# Patient Record
Sex: Female | Born: 1990 | Race: White | Hispanic: No | Marital: Single | Smoking: Former smoker
Health system: Southern US, Community
[De-identification: ages and names within clinical notes are randomized; demographics above are authoritative.]

## PROBLEM LIST (undated history)

## (undated) DIAGNOSIS — Z789 Other specified health status: Secondary | ICD-10-CM

## (undated) DIAGNOSIS — M419 Scoliosis, unspecified: Secondary | ICD-10-CM

## (undated) HISTORY — PX: APPENDECTOMY: SHX54

---

## 2013-01-24 ENCOUNTER — Encounter: Payer: Self-pay | Admitting: Obstetrics and Gynecology

## 2014-09-06 ENCOUNTER — Encounter: Payer: Self-pay | Admitting: Obstetrics & Gynecology

## 2014-09-12 ENCOUNTER — Ambulatory Visit (INDEPENDENT_AMBULATORY_CARE_PROVIDER_SITE_OTHER): Payer: BLUE CROSS/BLUE SHIELD | Admitting: Family Medicine

## 2014-09-12 ENCOUNTER — Ambulatory Visit (HOSPITAL_COMMUNITY)
Admission: RE | Admit: 2014-09-12 | Discharge: 2014-09-12 | Disposition: A | Discharge status: Home / Self Care | Payer: BC Managed Care – PPO | Source: Ambulatory Visit | Attending: Family Medicine | Admitting: Family Medicine

## 2014-09-12 ENCOUNTER — Encounter: Payer: Self-pay | Admitting: Family Medicine

## 2014-09-12 VITALS — BP 123/83 | HR 112 | Wt 110.0 lb

## 2014-09-12 DIAGNOSIS — R102 Pelvic and perineal pain: Secondary | ICD-10-CM

## 2014-09-12 DIAGNOSIS — N949 Unspecified condition associated with female genital organs and menstrual cycle: Secondary | ICD-10-CM | POA: Insufficient documentation

## 2014-09-12 DIAGNOSIS — Z30431 Encounter for routine checking of intrauterine contraceptive device: Secondary | ICD-10-CM | POA: Diagnosis not present

## 2014-09-12 LAB — POCT URINE PREGNANCY: Preg Test, Ur: NEGATIVE

## 2014-09-12 NOTE — Assessment & Plan Note (Signed)
?   Related to IUD--will check sono to confirm placement--discussed options for contraception, she might like to try Depo--as she would like to gain weight, if her IUD needs to be removed.

## 2014-09-12 NOTE — Patient Instructions (Signed)
Pelvic Pain Female pelvic pain can be caused by many different things and start from a variety of places. Pelvic pain refers to pain that is located in the lower half of the abdomen and between your hips. The pain may occur over a short period of time (acute) or may be reoccurring (chronic). The cause of pelvic pain may be related to disorders affecting the female reproductive organs (gynecologic), but it may also be related to the bladder, kidney stones, an intestinal complication, or muscle or skeletal problems. Getting help right away for pelvic pain is important, especially if there has been severe, sharp, or a sudden onset of unusual pain. It is also important to get help right away because some types of pelvic pain can be life threatening.  CAUSES  Below are only some of the causes of pelvic pain. The causes of pelvic pain can be in one of several categories.   Gynecologic.  Pelvic inflammatory disease.  Sexually transmitted infection.  Ovarian cyst or a twisted ovarian ligament (ovarian torsion).  Uterine lining that grows outside the uterus (endometriosis).  Fibroids, cysts, or tumors.  Ovulation.  Pregnancy.  Pregnancy that occurs outside the uterus (ectopic pregnancy).  Miscarriage.  Labor.  Abruption of the placenta or ruptured uterus.  Infection.  Uterine infection (endometritis).  Bladder infection.  Diverticulitis.  Miscarriage related to a uterine infection (septic abortion).  Bladder.  Inflammation of the bladder (cystitis).  Kidney stone(s).  Gastrointestinal.  Constipation.  Diverticulitis.  Neurologic.  Trauma.  Feeling pelvic pain because of mental or emotional causes (psychosomatic).  Cancers of the bowel or pelvis. EVALUATION  Your caregiver will want to take a careful history of your concerns. This includes recent changes in your health, a careful gynecologic history of your periods (menses), and a sexual history. Obtaining your family  history and medical history is also important. Your caregiver may suggest a pelvic exam. A pelvic exam will help identify the location and severity of the pain. It also helps in the evaluation of which organ system may be involved. In order to identify the cause of the pelvic pain and be properly treated, your caregiver may order tests. These tests may include:   A pregnancy test.  Pelvic ultrasonography.  An X-ray exam of the abdomen.  A urinalysis or evaluation of vaginal discharge.  Blood tests. HOME CARE INSTRUCTIONS   Only take over-the-counter or prescription medicines for pain, discomfort, or fever as directed by your caregiver.   Rest as directed by your caregiver.   Eat a balanced diet.   Drink enough fluids to make your urine clear or pale yellow, or as directed.   Avoid sexual intercourse if it causes pain.   Apply warm or cold compresses to the lower abdomen depending on which one helps the pain.   Avoid stressful situations.   Keep a journal of your pelvic pain. Write down when it started, where the pain is located, and if there are things that seem to be associated with the pain, such as food or your menstrual cycle.  Follow up with your caregiver as directed.  SEEK MEDICAL CARE IF:  Your medicine does not help your pain.  You have abnormal vaginal discharge. SEEK IMMEDIATE MEDICAL CARE IF:   You have heavy bleeding from the vagina.   Your pelvic pain increases.   You feel light-headed or faint.   You have chills.   You have pain with urination or blood in your urine.   You have uncontrolled diarrhea   or vomiting.   You have a fever or persistent symptoms for more than 3 days.  You have a fever and your symptoms suddenly get worse.   You are being physically or sexually abused.  MAKE SURE YOU:  Understand these instructions.  Will watch your condition.  Will get help if you are not doing well or get worse. Document Released:  07/08/2004 Document Revised: 12/26/2013 Document Reviewed: 12/01/2011 ExitCare Patient Information 2015 [employer]. This information is not intended to replace advice given to you by your health care provider. Make sure you discuss any questions you have with your health care provider.  

## 2014-09-12 NOTE — Progress Notes (Signed)
    Subjective:    Patient ID: Hayley May is a 24 y.o. female presenting with Contraception  on 09/12/2014  HPI: Mirena IUD placed 14 months ago.  Has sporadic cycles. Reports low abdominal pain since it was placed and feels like it is stabbing.  Pain comes daily and lasts 15-20 minutes. Notes bleeding after sex. Wants IUD checked. Also, notes no h/o pap, although one was likely done with pregnancy--does not think she has a h/o abnl pap.  Review of Systems  Constitutional: Positive for unexpected weight change (having trouble gaining weight). Negative for fever and chills.  Respiratory: Negative for shortness of breath.   Cardiovascular: Negative for chest pain.  Genitourinary: Positive for vaginal bleeding, vaginal discharge and pelvic pain. Negative for menstrual problem.      Objective:    BP 123/83 mmHg  Pulse 112  Wt 110 lb (49.896 kg) Physical Exam  Constitutional: She is oriented to person, place, and time. She appears well-developed and well-nourished. No distress.  HENT:  Head: Normocephalic and atraumatic.  Eyes: No scleral icterus.  Neck: Neck supple.  Cardiovascular: Normal rate.   Pulmonary/Chest: Effort normal.  Abdominal: Soft.  Genitourinary:  BUS normal, vagina is pink and rugated, cervix is mutiparous without lesion, IUD strings cannot be visualized   Neurological: She is alert and oriented to person, place, and time.  Skin: Skin is warm and dry.  Psychiatric: She has a normal mood and affect.        Assessment & Plan:   Problem List Items Addressed This Visit      Unprioritized   Pelvic pain in female - Primary    ? Related to IUD--will check sono to confirm placement--discussed options for contraception, she might like to try Depo--as she would like to gain weight, if her IUD needs to be removed.      Relevant Orders   US Pelvis Complete   US Transvaginal Non-OB   POCT urine pregnancy (Completed)       Return in about 4 weeks (around  10/10/2014).

## 2014-11-14 ENCOUNTER — Ambulatory Visit (INDEPENDENT_AMBULATORY_CARE_PROVIDER_SITE_OTHER): Payer: BLUE CROSS/BLUE SHIELD | Admitting: Family Medicine

## 2014-11-14 VITALS — BP 117/74 | HR 88 | Ht 65.5 in | Wt 111.2 lb

## 2014-11-14 DIAGNOSIS — Z975 Presence of (intrauterine) contraceptive device: Secondary | ICD-10-CM

## 2014-11-14 DIAGNOSIS — Z113 Encounter for screening for infections with a predominantly sexual mode of transmission: Secondary | ICD-10-CM

## 2014-11-14 DIAGNOSIS — N898 Other specified noninflammatory disorders of vagina: Secondary | ICD-10-CM | POA: Diagnosis not present

## 2014-11-14 DIAGNOSIS — R110 Nausea: Secondary | ICD-10-CM

## 2014-11-14 NOTE — Progress Notes (Signed)
Pelvic pain, vaginal discharge with odor.  Pain with intercourse. Wants STD screening.

## 2014-11-14 NOTE — Progress Notes (Signed)
    Subjective:    Patient ID: Hayley May is a 24 y.o. female presenting with Pelvic Pain  on 11/14/2014  HPI: Reports less often pain and no more bleeding after intercourse.  2 month h/o vaginal odor and discharge. Denies irritation. Reports OTC yeast that did not work.  Improved with rePhresh  Review of Systems  Constitutional: Negative for fever and chills.  Respiratory: Negative for shortness of breath.   Cardiovascular: Negative for chest pain.  Gastrointestinal: Negative for nausea, vomiting and abdominal pain.  Genitourinary: Negative for dysuria.  Skin: Negative for rash.      Objective:    BP 117/74 mmHg  Pulse 88  Ht 5' 5.5" (1.664 m)  Wt 111 lb 4 oz (50.463 kg)  BMI 18.22 kg/m2 Physical Exam  Constitutional: She is oriented to person, place, and time. She appears well-developed and well-nourished. No distress.  HENT:  Head: Normocephalic and atraumatic.  Eyes: No scleral icterus.  Neck: Neck supple.  Cardiovascular: Normal rate.   Pulmonary/Chest: Effort normal.  Abdominal: Soft.  Genitourinary: Vagina normal. No vaginal discharge found.  Neurological: She is alert and oriented to person, place, and time.  Skin: Skin is warm and dry.  Psychiatric: She has a normal mood and affect.        Assessment & Plan:   Problem List Items Addressed This Visit    None    Visit Diagnoses    Vaginal discharge    -  Primary    Relevant Orders    GC/Chlamydia Probe Amp (Completed)    Wet prep, genital    Screen for STD (sexually transmitted disease)        Relevant Orders    HIV antibody (Completed)    RPR (Completed)    Hepatitis B surface antigen (Completed)       Return in about 2 months (around 01/14/2015) for a follow-up.

## 2014-11-14 NOTE — Patient Instructions (Signed)

## 2014-11-15 ENCOUNTER — Encounter: Payer: Self-pay | Admitting: Family Medicine

## 2014-11-15 LAB — RPR

## 2014-11-15 LAB — GC/CHLAMYDIA PROBE AMP
CT Probe RNA: NEGATIVE
GC PROBE AMP APTIMA: NEGATIVE

## 2014-11-15 LAB — HEPATITIS B SURFACE ANTIGEN: Hepatitis B Surface Ag: NEGATIVE

## 2014-11-15 LAB — WET PREP, GENITAL
TRICH WET PREP: NONE SEEN
YEAST WET PREP: NONE SEEN

## 2014-11-15 LAB — HIV ANTIBODY (ROUTINE TESTING W REFLEX): HIV: NONREACTIVE

## 2014-11-20 ENCOUNTER — Telehealth: Payer: Self-pay | Admitting: *Deleted

## 2014-11-20 DIAGNOSIS — B9689 Other specified bacterial agents as the cause of diseases classified elsewhere: Secondary | ICD-10-CM

## 2014-11-20 DIAGNOSIS — N760 Acute vaginitis: Principal | ICD-10-CM

## 2014-11-20 MED ORDER — METRONIDAZOLE 500 MG PO TABS: 500.0000 mg | ORAL_TABLET | Freq: Two times a day (BID) | ORAL | Status: DC

## 2014-11-20 NOTE — Telephone Encounter (Signed)
Pt aware.  Will send in prescription.

## 2014-11-20 NOTE — Telephone Encounter (Signed)
-----   Message from Reva Boresanya S Pratt, MD sent at 11/15/2014  8:08 AM EDT ----- Has BV-re-treat with Flagyl 500 mg bid x 7 days, # 14, no RF
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
## 2015-04-19 ENCOUNTER — Other Ambulatory Visit (INDEPENDENT_AMBULATORY_CARE_PROVIDER_SITE_OTHER): Payer: BLUE CROSS/BLUE SHIELD | Admitting: *Deleted

## 2015-04-19 DIAGNOSIS — N898 Other specified noninflammatory disorders of vagina: Secondary | ICD-10-CM

## 2015-04-19 NOTE — Progress Notes (Signed)
History of Present Illness   Patient Identification Hayley May is a 24 y.o. female.  Chief Complaint  Vaginal Discharge   The patient complains vaginal discharge and odor. Onset of symptoms was 3 days. Severity of symptoms now is moderate. Symptoms have been constant. Other modifying factors include history of (Yeast/BV). Care prior to arrival consisted of OTC ointment, with no relief.    Plan:  Wet Prep today Advised to try OTC BV/Yeast medications and Probiotic until lab results are back

## 2015-04-20 LAB — WET PREP, GENITAL
Trich, Wet Prep: NONE SEEN
WBC, Wet Prep HPF POC: NONE SEEN
Yeast Wet Prep HPF POC: NONE SEEN

## 2016-10-20 ENCOUNTER — Encounter: Payer: Self-pay | Admitting: *Deleted

## 2016-10-20 ENCOUNTER — Encounter: Payer: Self-pay | Admitting: Obstetrics & Gynecology

## 2016-10-20 ENCOUNTER — Ambulatory Visit (INDEPENDENT_AMBULATORY_CARE_PROVIDER_SITE_OTHER): Payer: BLUE CROSS/BLUE SHIELD | Admitting: Obstetrics & Gynecology

## 2016-10-20 VITALS — BP 112/76 | HR 80 | Resp 18 | Ht 66.0 in | Wt 120.0 lb

## 2016-10-20 DIAGNOSIS — Z538 Procedure and treatment not carried out for other reasons: Secondary | ICD-10-CM

## 2016-10-20 DIAGNOSIS — Z975 Presence of (intrauterine) contraceptive device: Secondary | ICD-10-CM | POA: Diagnosis not present

## 2016-10-20 MED ORDER — NORGESTIMATE-ETH ESTRADIOL 0.25-35 MG-MCG PO TABS
1.0000 | ORAL_TABLET | Freq: Every day | ORAL | 11 refills | Status: DC
Start: 2016-10-20 — End: 2017-01-22

## 2016-10-20 NOTE — Patient Instructions (Signed)
Return to clinic for any scheduled appointments or for any gynecologic concerns as needed.   

## 2016-10-20 NOTE — Progress Notes (Signed)
    GYNECOLOGY OFFICE PROCEDURE NOTE  Hayley May is a 26 y.o. G2P1011 here for Mirena IUD removal due to concerning side effects. Desires OCPs instead.  IUD Removal Attempt Patient identified, informed consent performed, consent signed.  Patient was in the dorsal lithotomy position, normal external genitalia was noted.  A speculum was placed in the patient's vagina, normal discharge was noted, no lesions. The cervix was visualized, no lesions, no abnormal discharge.  The strings of the IUD were not visualized, so cervix was cleaned with Betadine, and Kelly forceps were introduced into the lower uterine segment and endometrial cavity. The IUD was palpated but not able to be grasped due to mucus discharge  And bleeding; IUD hook was also tried multiple times but was unsuccessful.  Procedure aborted.    Patient was given option of removal in OR or [facility]  [facility], this was scheduled on 11/17/16. OCPs were prescribed for her to use as directed for contraception after IUD removal.   Routine preventative health maintenance measures emphasized. She will return for pap smear here in 11/2016.   Jaynie CollinsUGONNA  Lanson Randle, MD, FACOG Attending Obstetrician & Gynecologist, [facility], [facility]

## 2016-11-17 ENCOUNTER — Ambulatory Visit: Payer: BLUE CROSS/BLUE SHIELD | Admitting: Obstetrics & Gynecology

## 2016-11-25 ENCOUNTER — Encounter: Payer: BLUE CROSS/BLUE SHIELD | Admitting: Obstetrics & Gynecology

## 2016-11-25 DIAGNOSIS — Z01419 Encounter for gynecological examination (general) (routine) without abnormal findings: Secondary | ICD-10-CM

## 2017-01-08 ENCOUNTER — Other Ambulatory Visit: Payer: Self-pay | Admitting: Obstetrics and Gynecology

## 2017-01-08 DIAGNOSIS — Z369 Encounter for antenatal screening, unspecified: Secondary | ICD-10-CM

## 2017-01-22 ENCOUNTER — Encounter: Payer: Self-pay | Admitting: *Deleted

## 2017-01-22 ENCOUNTER — Ambulatory Visit
Admission: RE | Admit: 2017-01-22 | Discharge: 2017-01-22 | Disposition: A | Discharge status: Home / Self Care | Payer: BLUE CROSS/BLUE SHIELD | Source: Ambulatory Visit | Attending: Maternal and Fetal Medicine | Admitting: Maternal and Fetal Medicine

## 2017-01-22 VITALS — BP 125/79 | HR 95 | Temp 97.9°F | Resp 18 | Ht 66.0 in | Wt 126.0 lb

## 2017-01-22 DIAGNOSIS — Z3A12 12 weeks gestation of pregnancy: Secondary | ICD-10-CM | POA: Diagnosis not present

## 2017-01-22 DIAGNOSIS — Z820 Family history of epilepsy and other diseases of the nervous system: Secondary | ICD-10-CM | POA: Diagnosis not present

## 2017-01-22 DIAGNOSIS — Z8279 Family history of other congenital malformations, deformations and chromosomal abnormalities: Secondary | ICD-10-CM | POA: Insufficient documentation

## 2017-01-22 DIAGNOSIS — Z369 Encounter for antenatal screening, unspecified: Secondary | ICD-10-CM

## 2017-01-22 DIAGNOSIS — Z3481 Encounter for supervision of other normal pregnancy, first trimester: Secondary | ICD-10-CM | POA: Insufficient documentation

## 2017-01-22 HISTORY — DX: Other specified health status: Z78.9

## 2017-01-22 NOTE — Progress Notes (Addendum)
Referring physician:  [facility] Length of Consultation: 45 minutes   Hayley May  was referred to [facility] [place] for genetic counseling to review prenatal screening and testing options.  We also reviewed the family history from her prior visit and the history of a heart defect in her current partner.  This note summarizes the information we discussed.    We offered the following routine screening tests for this pregnancy:  First trimester screening, which includes nuchal translucency ultrasound screen and first trimester maternal serum marker screening.  The nuchal translucency has approximately an 80% detection rate for Down syndrome and can be positive for other chromosome abnormalities as well as congenital heart defects.  When combined with a maternal serum marker screening, the detection rate is up to 90% for Down syndrome and up to 97% for trisomy 18.     Maternal serum marker screening, a blood test that measures pregnancy proteins, can provide risk assessments for Down syndrome, trisomy 18, and open neural tube defects (spina bifida, anencephaly). Because it does not directly examine the fetus, it cannot positively diagnose or rule out these problems.  Targeted ultrasound uses high frequency sound waves to create an image of the developing fetus.  An ultrasound is often recommended as a routine means of evaluating the pregnancy.  It is also used to screen for fetal anatomy problems (for example, a heart defect) that might be suggestive of a chromosomal or other abnormality.   Should these screening tests indicate an increased concern, then the following additional testing options would be offered:  The chorionic villus sampling procedure is available for first trimester chromosome analysis.  This involves the withdrawal of a small amount of chorionic villi (tissue from the developing placenta).  Risk of pregnancy loss is estimated to be  approximately 1 in 200 to 1 in 100 (0.5 to 1%).  There is approximately a 1% (1 in 100) chance that the CVS chromosome results will be unclear.  Chorionic villi cannot be tested for neural tube defects.     Amniocentesis involves the removal of a small amount of amniotic fluid from the sac surrounding the fetus with the use of a thin needle inserted through the maternal abdomen and uterus.  Ultrasound guidance is used throughout the procedure.  Fetal cells from amniotic fluid are directly evaluated and > 99.5% of chromosome problems and > 98% of open neural tube defects can be detected. This procedure is generally performed after the 15th week of pregnancy.  The main risks to this procedure include complications leading to miscarriage in less than 1 in 200 cases (0.5%).  As another option for information if the pregnancy is suspected to be an an increased chance for certain chromosome conditions, we also reviewed the availability of cell free fetal DNA testing from maternal blood to determine whether or not the baby may have either Down syndrome, trisomy 71, or trisomy 58.  This test utilizes a maternal blood sample and DNA sequencing technology to isolate circulating cell free fetal DNA from maternal plasma.  The fetal DNA can then be analyzed for DNA sequences that are derived from the three most common chromosomes involved in aneuploidy, chromosomes 13, 18, and 21.  If the overall amount of DNA is greater than the expected level for any of these chromosomes, aneuploidy is suspected.  While we do not consider it a replacement for invasive testing and karyotype analysis, a negative result from this testing would be reassuring, though not  a guarantee of a normal chromosome complement for the baby.  An abnormal result is certainly suggestive of an abnormal chromosome complement, though we would still recommend CVS or amniocentesis to confirm any findings from this testing.  Cystic Fibrosis and Spinal Muscular  Atrophy (SMA) screening were also discussed with the patient. Both conditions are recessive, which means that both parents must be carriers in order to have a child with the disease.  Cystic fibrosis (CF) is one of the most common genetic conditions in persons of Caucasian ancestry.  This condition occurs in approximately 1 in 2,500 Caucasian persons and results in thickened secretions in the lungs, digestive, and reproductive systems.  For a baby to be at risk for having CF, both of the parents must be carriers for this condition.  Approximately 1 in 53 Caucasian persons is a carrier for CF.  Current carrier testing looks for the most common mutations in the gene for CF and can detect approximately 90% of carriers in the Caucasian population.  This means that the carrier screening can greatly reduce, but cannot eliminate, the chance for an individual to have a child with CF.  If an individual is found to be a carrier for CF, then carrier testing would be available for the partner. As part of [facility]'s newborn screening profile, all babies born in the state of [place] will have a two-tier screening process.  Specimens are first tested to determine the concentration of immunoreactive trypsinogen (IRT).  The top 5% of specimens with the highest IRT values then undergo DNA testing using a panel of over 40 common CF mutations. SMA is a neurodegenerative disorder that leads to atrophy of skeletal muscle and overall weakness.  This condition is also more prevalent in the Caucasian population, with 1 in 40-1 in 60 persons being a carrier and 1 in 6,000-1 in 10,000 children being affected.  There are multiple forms of the disease, with some causing death in infancy to other forms with survival into adulthood.  The genetics of SMA is complex, but carrier screening can detect up to 95% of carriers in the Caucasian population.  Similar to CF, a negative result can greatly reduce, but cannot eliminate, the chance  to have a child with SMA.  We obtained a detailed family history and pregnancy history.  The father of the baby, Hayley May, stated that he was born with a VSD.  He recalls that it was large when he was young, but that by high school it was very small and he was allowed to play contact sports.  At his recent doctor visits, he says that it can no longer be heard on physical exam.  He has no other health or developmental concerns.  He does report a maternal great uncle who passed away in his teens.  This relative was said to have been born with a large VSD and then was hit in the chest with a football causing sudden death.  No other family members have had cardiac defects.  Congenital heart defects are among the most common birth defects, occuring in approximately 1 in 200 livebirths.  They may occur as an isolated condition or in combination with other differences in numerous genetic syndromes.  In the absence of a known genetic syndrome, most congenital heart defects are multifactorial, or caused by a combination of genetic and environmental factors.  Given the father of this baby had a heart defect, the chance for a heart defect in this pregnancy may be as  high as 3%.  However, if more information is learned or a specific genetic syndrome is identified as the cause, we are happy to discuss this information further.  We discussed that we may diagnose many heart defects by using a level 2 ultrasound after 16 to [redacted] weeks gestation.  A fetal echocardiogram is recommended.  This is a specialized ultrasound of the fetal heart performed by a pediatric cardiologist after [redacted] weeks gestation.  However, it is important to remember that not all heart defects can be detected prenatally.    We obtained a detailed family history of Hayley May in her last pregnancy.  She has scoliosis, which is estimated to have a recurrence chance of 5-7%.  She reported a nephew with apparently isolated unilateral club foot, which is  expected to have a low recurrence risk.  She also mentioned a maternal first cousin with developmental differences, ear problems and Asperger.  Again we reviewed that without a clear diagnosis for her underlying condition, an accurate risk estimate for other family members is difficult.  We are happy to review medical information if more is learned.  The remainder of the family history was reported to be unremarkable for birth defects, intellectual delays, recurrent pregnancy loss or known chromosome abnormalities.  Hayley May stated that this is her third pregnancy.  She had an early miscarriage in 2007, has a healthy son who is three, and now is pregnant with a new partner.  He has two healthy daughters from prior relationships.  She reported no complications or exposures in this pregnancy other than medications of nausea given by her OB.  She stopped smoking as soon as she found out she was pregnant this time.  In her last pregnancy, Hayley May was transferred to [facility] care and for delivery due to her scoliosis.  See those notes from Dr. Leatha GildingLivingston in 2014 if needed.  After consideration of the options, Hayley May elected to proceed with first trimester screening.  She declined carrier testing for CF and SMA.    An ultrasound was performed at the time of the visit.  The gestational age was consistent with 12 weeks.  Fetal anatomy could not be assessed due to early gestational age.  Please refer to the ultrasound report for details of that study.  An anatomy ultrasound should be performed after [redacted] weeks gestation and a fetal echocardiogram should be scheduled for after [redacted] weeks gestation.  Please let us know if we can help provide or schedule these appointments.  Hayley May was encouraged to call with questions or concerns.  We can be contacted at [phone].    Cherly Andersoneborah F. Wells, MS, CGC  I was immediately available and supervising. Camelia PhenesBrenna L Kylie Gros, MD [facility]

## 2017-01-29 ENCOUNTER — Telehealth: Payer: Self-pay | Admitting: Obstetrics and Gynecology

## 2017-01-29 NOTE — Telephone Encounter (Signed)
Ms. Hayley May  elected to undergo First Trimester screening on 01/22/2017.  To review, first trimester screening, includes nuchal translucency ultrasound screen and/or first trimester maternal serum marker screening.  The nuchal translucency has approximately an 80% detection rate for Down syndrome and can be positive for other chromosome abnormalities as well as heart defects.  When combined with a maternal serum marker screening, the detection rate is up to 90% for Down syndrome and up to 97% for trisomy 13 and 18.     The results of the First Trimester Nuchal Translucency and Biochemical Screening were within normal range.  The risk for Down syndrome is now estimated to be 1 in 3,703.  The risk for Trisomy 13/18 is less than 1 in 10,000.  Should more definitive information be desired, we would offer amniocentesis.  Because we do not yet know the effectiveness of combined first and second trimester screening, we do not recommend a maternal serum screen to assess the chance for chromosome conditions.  However, if screening for neural tube defects is desired, maternal serum screening for AFP only can be performed between 15 and [redacted] weeks gestation.     Cherly Andersoneborah F. Shericka Johnstone, MS, CGC

## 2017-02-17 ENCOUNTER — Inpatient Hospital Stay: Admission: RE | Admit: 2017-02-17 | Payer: BLUE CROSS/BLUE SHIELD | Source: Ambulatory Visit

## 2017-03-09 ENCOUNTER — Other Ambulatory Visit: Payer: Self-pay | Admitting: Maternal & Fetal Medicine

## 2017-03-09 ENCOUNTER — Ambulatory Visit
Admission: RE | Admit: 2017-03-09 | Discharge: 2017-03-09 | Disposition: A | Discharge status: Home / Self Care | Payer: Medicaid Other | Source: Ambulatory Visit | Attending: Maternal & Fetal Medicine | Admitting: Maternal & Fetal Medicine

## 2017-03-09 DIAGNOSIS — M419 Scoliosis, unspecified: Secondary | ICD-10-CM | POA: Diagnosis not present

## 2017-03-09 DIAGNOSIS — Z8279 Family history of other congenital malformations, deformations and chromosomal abnormalities: Secondary | ICD-10-CM | POA: Diagnosis present

## 2017-03-09 DIAGNOSIS — O9989 Other specified diseases and conditions complicating pregnancy, childbirth and the puerperium: Secondary | ICD-10-CM | POA: Insufficient documentation

## 2017-03-09 DIAGNOSIS — Z3A18 18 weeks gestation of pregnancy: Secondary | ICD-10-CM | POA: Insufficient documentation

## 2017-03-09 HISTORY — DX: Scoliosis, unspecified: M41.9

## 2017-10-29 ENCOUNTER — Encounter (HOSPITAL_COMMUNITY): Payer: Self-pay

## 2018-11-24 IMAGING — US US MFM OB DETAIL+14 WK
1 of 2 series · 12 of 28 positions shown · non-contrast
Comparison: none

PATIENT INFO:

PERFORMED BY:
SERVICE(S) PROVIDED:
INDICATIONS:
18 weeks gestation of pregnancy
FETAL EVALUATION:
Num Of Fetuses:     1
Fetal Heart         147
Rate(bpm):
Cardiac Activity:   Present
Presentation:       Vertex
Placenta:           Posterior Grade 1, No previa
Largest Pocket(cm)
4.1
BIOMETRY:
BPD:      41.1  mm     G. Age:  18w 3d         46  %    CI:          79.4  %    70 - 86
FL/HC:       18.8  %    16.1 -
HC:      145.8  mm     G. Age:  17w 5d         10  %
FL/BPD:      66.7  %
FL:       27.4  mm     G. Age:  18w 3d         37  %
HUM:      26.4  mm     G. Age:  18w 2d         46  %
CER:        18  mm     G. Age:  18w 0d         30  %
NFT:       1.9  mm
CM:        1.7  mm
GESTATIONAL AGE:
LMP:           18w 4d        Date:  10/30/16                 EDD:   08/06/17
U/S Today:     18w 1d                                        EDD:   08/09/17
Best:          18w 4d     Det. By:  LMP  (10/30/16)          EDD:   08/06/17
ANATOMY:
Cranium:               Within Normal Limits   Aortic Arch:            Normal appearance
Cavum:                 CSP visualized         Ductal Arch:            Normal appearance
Ventricles:            Normal appearance      Diaphragm:              Within Normal Limits
Choroid Plexus:        Within Normal Limits   Stomach:                Seen
Cerebellum:            Within Normal Limits   Abdomen:                Within Normal
Limits
Posterior Fossa:       Within Normal Limits   Abdominal Wall:         Normal appearance
Nuchal Fold:           Within Normal Limits   Cord Vessels:           3 vessels
Face:                  Orbits visualized      Kidneys:                Normal appearance
Lips:                  Normal appearance      Bladder:                Seen
Thoracic:              Within Normal Limits   Spine:                  Normal appearance
Heart:                 4-Chamber view         Upper Extremities:      Visualized
appears normal
RVOT:                  Normal appearance      Lower Extremities:      Visualized
LVOT:                  Normal appearance
CERVIX UTERUS ADNEXA:
Cervix
Length:           4.94  cm.
Left Ovary
Size(cm)       2.8  x   2.06   x  1.59      Vol(ml):
Comment:      Right ovary is not visualized. right adnexa
appears WNL.

[Series 1: us mfm ob detail+14 wk · 0.25mm/px · 12 of 75 slices shown]
[im 3/75]
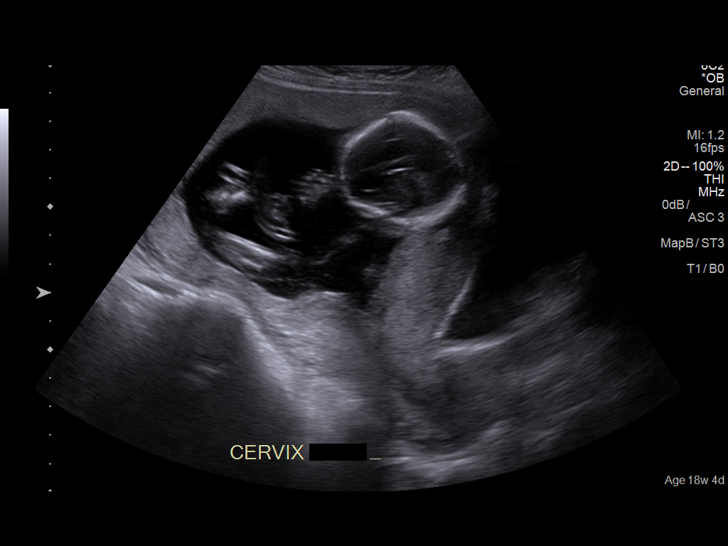
[im 9/75]
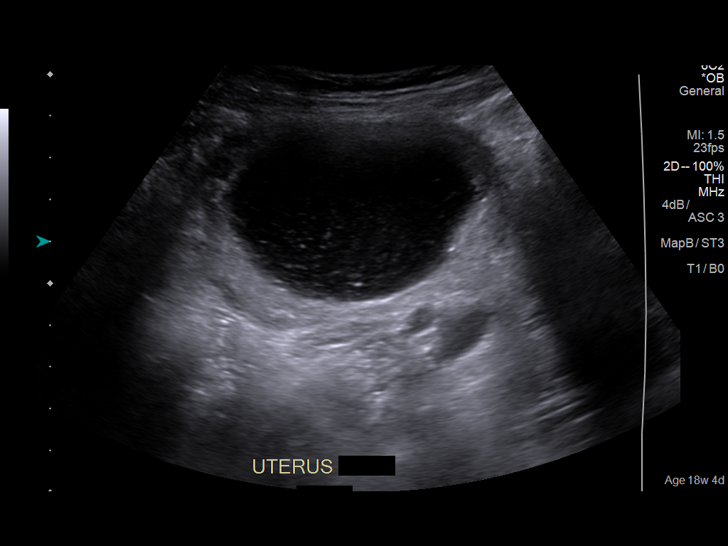
[im 15/75]
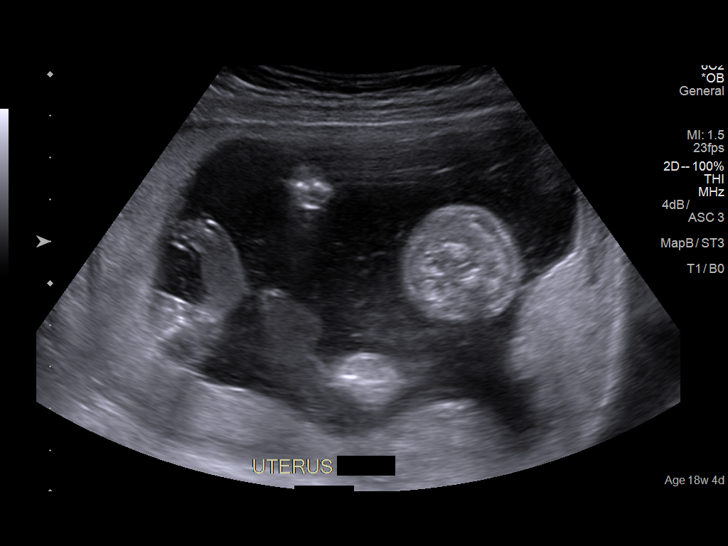
[im 23/75]
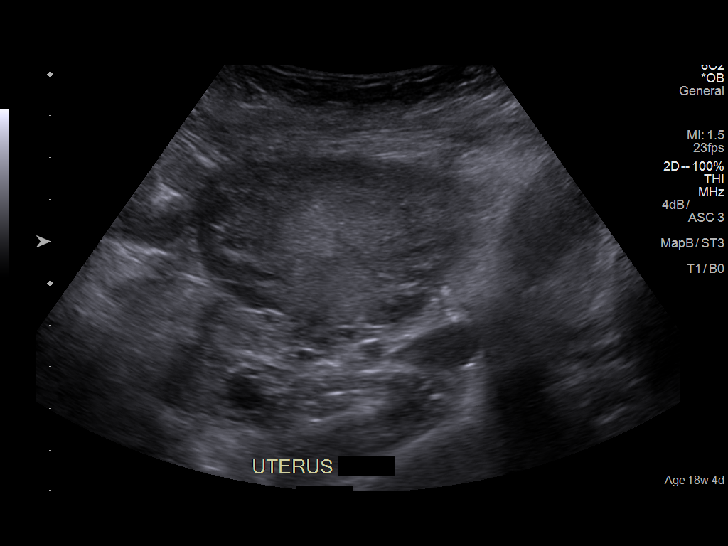
[im 29/75]
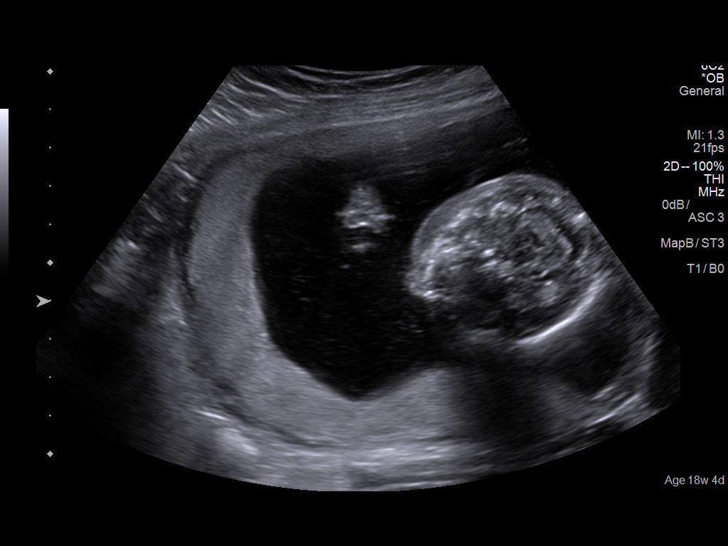
[im 35/75]
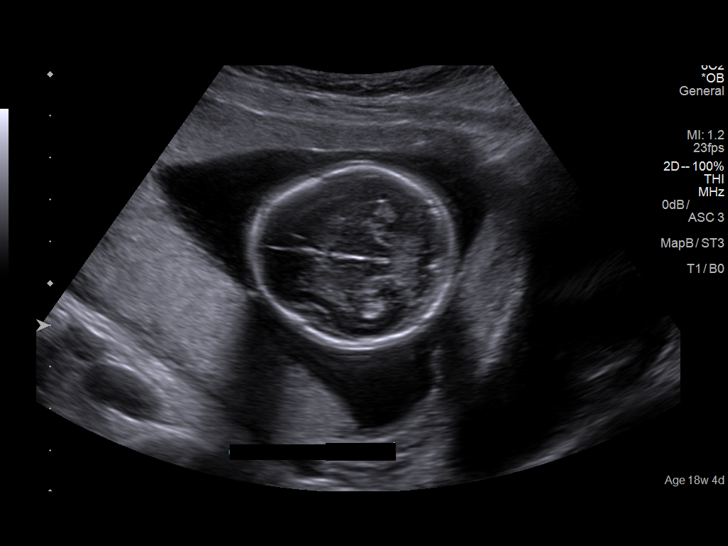
[im 43/75]
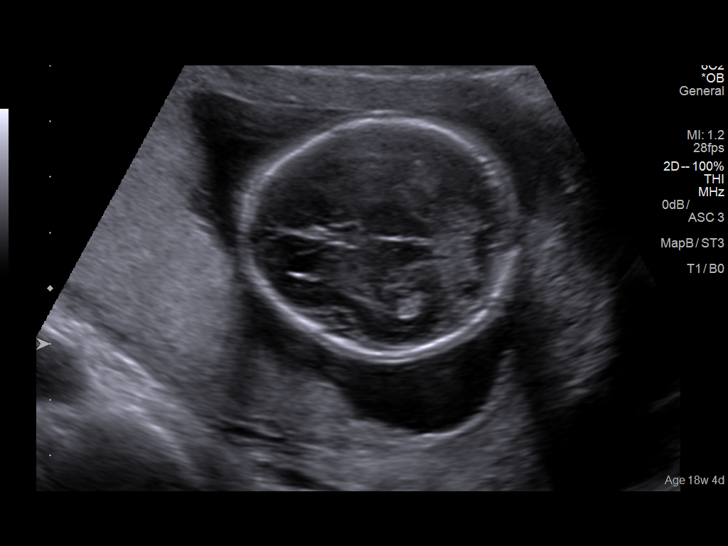
[im 49/75]
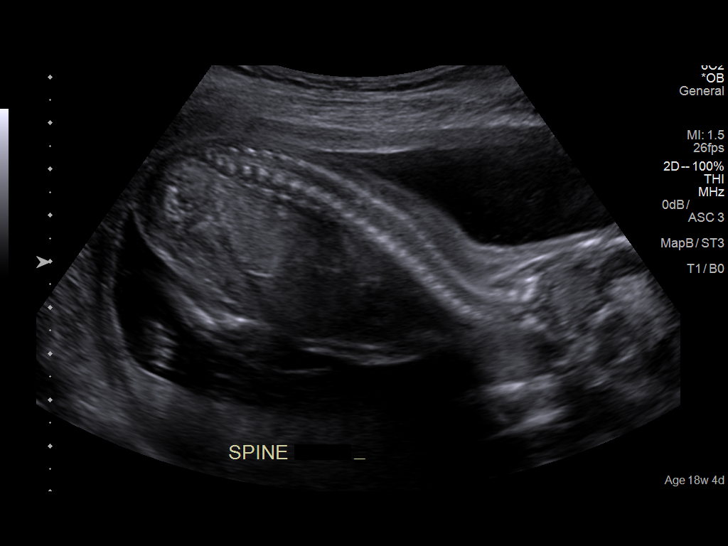
[im 55/75]
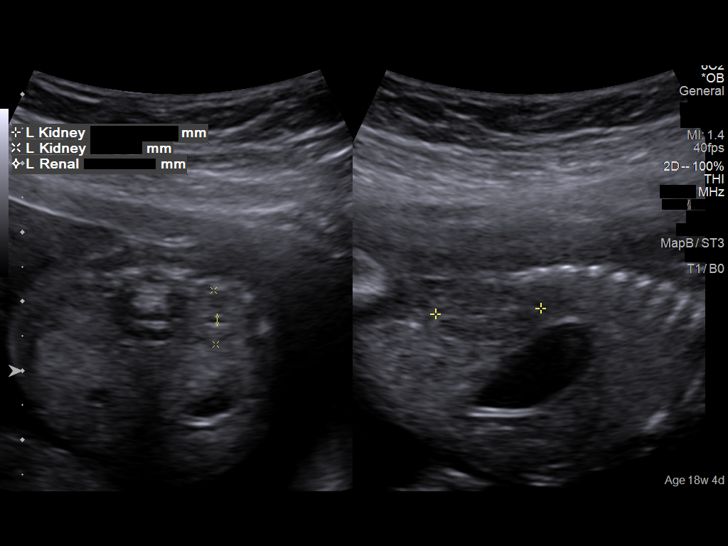
[im 63/75]
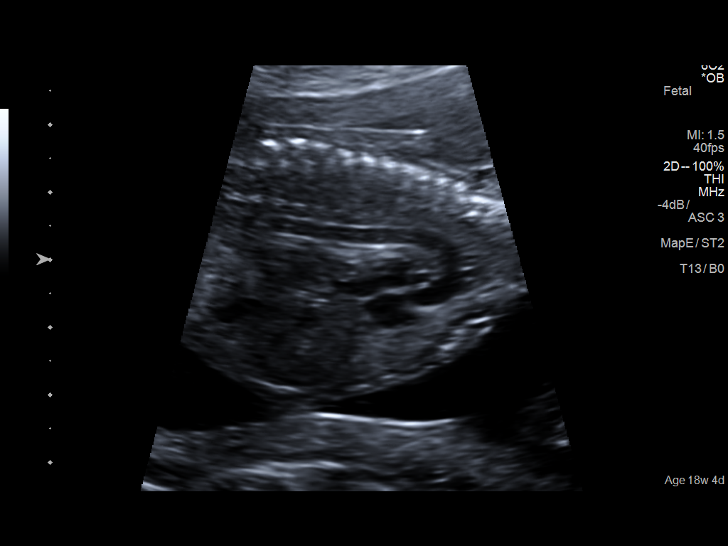
[im 69/75]
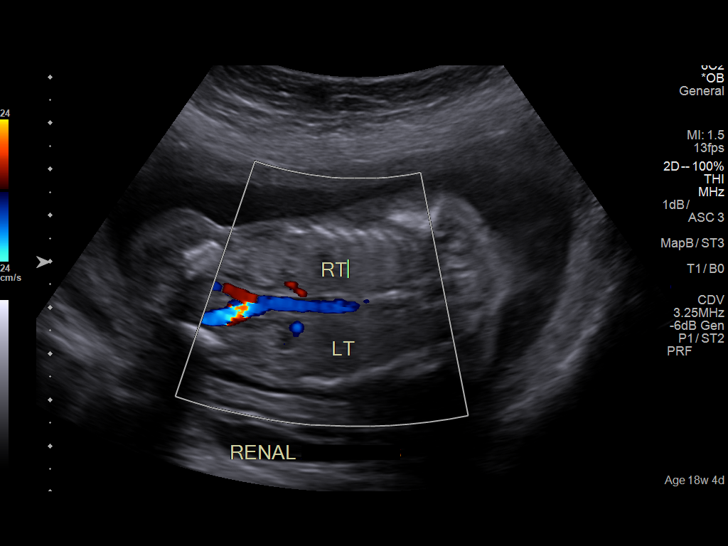
[im 75/75]
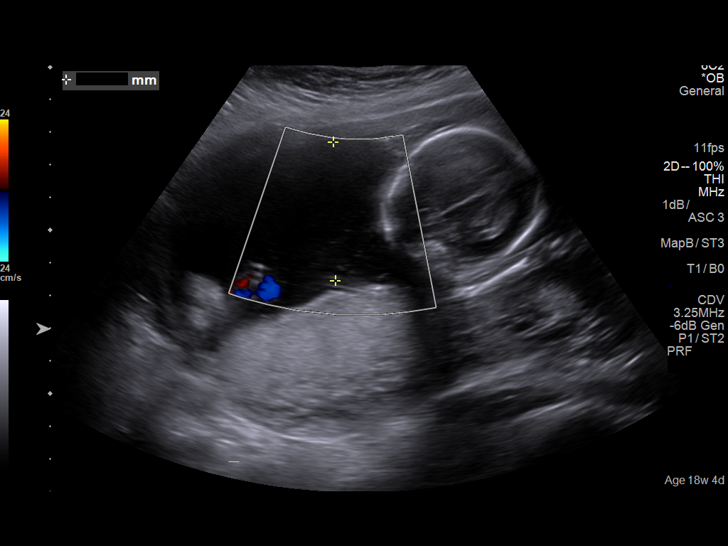

[12 of 28 positions shown; findings below may reference images not displayed]

IMPRESSION: Dear Ms.   Mayuko,

Thank you for referring your patient to [facility] for
detailed anatomy US.  The patient has scoliosis.  The father
of the baby had a VSD.  The patient has had normal first
trimester screening.

There is a singleton gestation at 05w1d gestation.  Dating is
by LMP consistent with 85w5d US performed here on
01/22/2017.

The fetal biometry correlates with established dating.

Detailed evaluation of the fetal anatomy was performed.  The
fetal anatomical survey appears within normal limits.

Thank you for allowing us to participate in your patient's care.

assistance.

Nika Freda

## 2020-01-24 ENCOUNTER — Encounter: Payer: Self-pay | Admitting: Emergency Medicine

## 2020-01-24 ENCOUNTER — Ambulatory Visit
Admission: EM | Admit: 2020-01-24 | Discharge: 2020-01-24 | Disposition: A | Discharge status: Home / Self Care | Payer: BLUE CROSS/BLUE SHIELD | Attending: Family Medicine | Admitting: Family Medicine

## 2020-01-24 ENCOUNTER — Other Ambulatory Visit: Payer: Self-pay

## 2020-01-24 DIAGNOSIS — R21 Rash and other nonspecific skin eruption: Secondary | ICD-10-CM | POA: Diagnosis not present

## 2020-01-24 DIAGNOSIS — W57XXXA Bitten or stung by nonvenomous insect and other nonvenomous arthropods, initial encounter: Secondary | ICD-10-CM

## 2020-01-24 MED ORDER — DOXYCYCLINE HYCLATE 100 MG PO CAPS: 100.0000 mg | ORAL_CAPSULE | Freq: Two times a day (BID) | ORAL | 0 refills | Status: AC

## 2020-01-24 NOTE — ED Provider Notes (Signed)
[facility]    CSN: [id number] Arrival date & time: 01/24/20  1046      History   Chief Complaint Chief Complaint  Patient presents with  . Tick Removal   HPI  29 year old female presents with rash.  Patient bitten by tick on her right upper thigh on Thursday.  She states that she has developed a surrounding raised rash with associated itching.  She states that the area looks "gnarly".  She states that she has had an ongoing migraine headache which she is having difficulty getting to resolve.  She is concerned that she may be developing Lyme disease given the rash surrounding the tick bite area.  No fever.  No other associated symptoms.  No other complaints.  Patient Active Problem List   Diagnosis Date Noted  . Family history of VSD (ventricular septal defect)   . First trimester screening   . Pelvic pain in female 09/12/2014    Past Surgical History:  Procedure Laterality Date  . APPENDECTOMY      OB History    Gravida  3   Para  1   Term  1   Preterm      AB  1   Living  1     SAB  1   TAB      Ectopic      Multiple      Live Births               Home Medications    Prior to Admission medications   Medication Sig Start Date End Date Taking? Authorizing Provider  doxycycline (VIBRAMYCIN) 100 MG capsule Take 1 capsule (100 mg total) by mouth 2 (two) times daily. 01/24/20   Coral Spikes, DO    Family History Family History  Problem Relation Age of Onset  . Alcohol abuse Father   . Alcohol abuse Brother   . Cancer Maternal Aunt        ovarian, stomach,throat  . Cancer Maternal Grandmother        breast  . Heart disease Maternal Grandmother   . Hypertension Maternal Grandmother   . Diabetes Maternal Grandmother   . Alcohol abuse Maternal Grandfather     Social History Social History   Tobacco Use  . Smoking status: Former Smoker    Packs/day: 0.00  . Smokeless tobacco: Never Used  . Tobacco comment: 1-2 cigarettes per  day  Substance Use Topics  . Alcohol use: No    Alcohol/week: 0.0 standard drinks  . Drug use: No     Allergies   Codeine and Vicodin [hydrocodone-acetaminophen]   Review of Systems Review of Systems  Skin:       Rash, tick bite.   Physical Exam Triage Vital Signs ED Triage Vitals  Enc Vitals Group     BP 01/24/20 1124 115/75     Pulse Rate 01/24/20 1124 85     Resp 01/24/20 1124 18     Temp 01/24/20 1124 98.8 F (37.1 C)     Temp Source 01/24/20 1124 Oral     SpO2 01/24/20 1124 100 %     Weight 01/24/20 1122 120 lb (54.4 kg)     Height 01/24/20 1122 5\' 6"  (1.676 m)     Head Circumference --      Peak Flow --      Pain Score 01/24/20 1122 0     Pain Loc --      Pain Edu? --  Excl. in GC? --    Updated Vital Signs BP 115/75 (BP Location: Right Arm)   Pulse 85   Temp 98.8 F (37.1 C) (Oral)   Resp 18   Ht 5\' 6"  (1.676 m)   Wt 54.4 kg   LMP 01/22/2020   SpO2 100%   BMI 19.37 kg/m   Visual Acuity Right Eye Distance:   Left Eye Distance:   Bilateral Distance:    Right Eye Near:   Left Eye Near:    Bilateral Near:     Physical Exam Vitals and nursing note reviewed.  Constitutional:      General: She is not in acute distress.    Appearance: Normal appearance. She is not ill-appearing.  HENT:     Head: Normocephalic and atraumatic.  Eyes:     General:        Right eye: No discharge.        Left eye: No discharge.     Conjunctiva/sclera: Conjunctivae normal.  Pulmonary:     Effort: Pulmonary effort is normal. No respiratory distress.  Skin:    Comments: Raised, erythematous rash of the right upper thigh/groin.  Neurological:     Mental Status: She is alert.  Psychiatric:        Mood and Affect: Mood normal.        Behavior: Behavior normal.    UC Treatments / Results  Labs (all labs ordered are listed, but only abnormal results are displayed) Labs Reviewed - No data to display  EKG   Radiology No results  found.  Procedures Procedures (including critical care time)  Medications Ordered in UC Medications - No data to display  Initial Impression / Assessment and Plan / UC Course  I have reviewed the triage vital signs and the nursing notes.  Pertinent labs & imaging results that were available during my care of the patient were reviewed by me and considered in my medical decision making (see chart for details).    29 year old female presents with rash following recent tick bite.  Concern for erythema migrans.  Placing on doxycycline.  Final Clinical Impressions(s) / UC Diagnoses   Final diagnoses:  Rash and nonspecific skin eruption  Tick bite, initial encounter     Discharge Instructions     Antibiotic as prescribed.  Take care  Dr. 26    ED Prescriptions    Medication Sig Dispense Auth. Provider   doxycycline (VIBRAMYCIN) 100 MG capsule Take 1 capsule (100 mg total) by mouth 2 (two) times daily. 20 capsule Adriana Simas, DO     PDMP not reviewed this encounter.   Tommie Sams, Tommie Sams 01/24/20 1229

## 2020-01-24 NOTE — ED Triage Notes (Signed)
Patient states she was bit by a tick last Thursday on her right upper thigh.  She reports an itchy rash to the area.

## 2020-01-24 NOTE — Discharge Instructions (Signed)
Antibiotic as prescribed.  Take care  Dr. Fatimata Talsma
